# Patient Record
Sex: Male | Born: 1975 | Race: Black or African American | Hispanic: No | Marital: Single | Smoking: Current every day smoker
Health system: Southern US, Community
[De-identification: ages and names within clinical notes are randomized; demographics above are authoritative.]

## PROBLEM LIST (undated history)

## (undated) DIAGNOSIS — I10 Essential (primary) hypertension: Secondary | ICD-10-CM

---

## 2013-03-03 ENCOUNTER — Emergency Department (HOSPITAL_COMMUNITY)
Admission: EM | Admit: 2013-03-03 | Discharge: 2013-03-03 | Disposition: A | Discharge status: Home / Self Care | Payer: No Typology Code available for payment source | Attending: Emergency Medicine | Admitting: Emergency Medicine

## 2013-03-03 ENCOUNTER — Encounter (HOSPITAL_COMMUNITY): Payer: Self-pay | Admitting: Adult Health

## 2013-03-03 ENCOUNTER — Emergency Department (HOSPITAL_COMMUNITY): Payer: No Typology Code available for payment source

## 2013-03-03 DIAGNOSIS — Y9389 Activity, other specified: Secondary | ICD-10-CM | POA: Insufficient documentation

## 2013-03-03 DIAGNOSIS — Y92410 Unspecified street and highway as the place of occurrence of the external cause: Secondary | ICD-10-CM | POA: Insufficient documentation

## 2013-03-03 DIAGNOSIS — S199XXA Unspecified injury of neck, initial encounter: Secondary | ICD-10-CM | POA: Insufficient documentation

## 2013-03-03 DIAGNOSIS — S0993XA Unspecified injury of face, initial encounter: Secondary | ICD-10-CM | POA: Insufficient documentation

## 2013-03-03 MED ORDER — DIAZEPAM 5 MG PO TABS: ORAL_TABLET | ORAL | Status: DC

## 2013-03-03 MED ORDER — IBUPROFEN 400 MG PO TABS
800.0000 mg | ORAL_TABLET | Freq: Once | ORAL | Status: AC
  Administered 2013-03-03: 800 mg via ORAL
  Filled 2013-03-03: qty 2

## 2013-03-03 MED ORDER — HYDROCODONE-ACETAMINOPHEN 5-325 MG PO TABS: ORAL_TABLET | ORAL | Status: DC
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
## 2013-03-03 NOTE — ED Notes (Signed)
Pt c/o neck pain. Pt in no distress. C collar in place.
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
## 2013-03-04 NOTE — ED Provider Notes (Signed)
Medical screening examination/treatment/procedure(s) were performed by non-physician practitioner and as supervising physician I was immediately available for consultation/collaboration.   Calyn Rubi, MD 03/04/13 2144 

## 2019-08-04 ENCOUNTER — Emergency Department (HOSPITAL_COMMUNITY): Payer: Self-pay

## 2019-08-04 ENCOUNTER — Encounter (HOSPITAL_COMMUNITY): Payer: Self-pay | Admitting: Emergency Medicine

## 2019-08-04 ENCOUNTER — Emergency Department (HOSPITAL_COMMUNITY)
Admission: EM | Admit: 2019-08-04 | Discharge: 2019-08-04 | Disposition: A | Discharge status: Home / Self Care | Payer: Self-pay | Attending: Emergency Medicine | Admitting: Emergency Medicine

## 2019-08-04 ENCOUNTER — Other Ambulatory Visit: Payer: Self-pay

## 2019-08-04 DIAGNOSIS — T1490XA Injury, unspecified, initial encounter: Secondary | ICD-10-CM

## 2019-08-04 DIAGNOSIS — S81801A Unspecified open wound, right lower leg, initial encounter: Secondary | ICD-10-CM | POA: Insufficient documentation

## 2019-08-04 DIAGNOSIS — F1010 Alcohol abuse, uncomplicated: Secondary | ICD-10-CM | POA: Insufficient documentation

## 2019-08-04 DIAGNOSIS — Z23 Encounter for immunization: Secondary | ICD-10-CM | POA: Insufficient documentation

## 2019-08-04 DIAGNOSIS — Y9389 Activity, other specified: Secondary | ICD-10-CM | POA: Insufficient documentation

## 2019-08-04 DIAGNOSIS — M79661 Pain in right lower leg: Secondary | ICD-10-CM

## 2019-08-04 DIAGNOSIS — Y998 Other external cause status: Secondary | ICD-10-CM | POA: Insufficient documentation

## 2019-08-04 DIAGNOSIS — Y906 Blood alcohol level of 120-199 mg/100 ml: Secondary | ICD-10-CM | POA: Insufficient documentation

## 2019-08-04 DIAGNOSIS — W3400XA Accidental discharge from unspecified firearms or gun, initial encounter: Secondary | ICD-10-CM

## 2019-08-04 DIAGNOSIS — I998 Other disorder of circulatory system: Secondary | ICD-10-CM

## 2019-08-04 DIAGNOSIS — Y92810 Car as the place of occurrence of the external cause: Secondary | ICD-10-CM | POA: Insufficient documentation

## 2019-08-04 DIAGNOSIS — I10 Essential (primary) hypertension: Secondary | ICD-10-CM | POA: Insufficient documentation

## 2019-08-04 HISTORY — DX: Essential (primary) hypertension: I10

## 2019-08-04 LAB — CBC
HCT: 47.1 % (ref 39.0–52.0)
Hemoglobin: 15.8 g/dL (ref 13.0–17.0)
MCH: 28.5 pg (ref 26.0–34.0)
MCHC: 33.5 g/dL (ref 30.0–36.0)
MCV: 84.9 fL (ref 80.0–100.0)
Platelets: 269 10*3/uL (ref 150–400)
RBC: 5.55 MIL/uL (ref 4.22–5.81)
RDW: 14.6 % (ref 11.5–15.5)
WBC: 12.2 10*3/uL — ABNORMAL HIGH (ref 4.0–10.5)
nRBC: 0 % (ref 0.0–0.2)

## 2019-08-04 LAB — COMPREHENSIVE METABOLIC PANEL
ALT: 26 U/L (ref 0–44)
AST: 29 U/L (ref 15–41)
Albumin: 4.3 g/dL (ref 3.5–5.0)
Alkaline Phosphatase: 57 U/L (ref 38–126)
Anion gap: 13 (ref 5–15)
BUN: 9 mg/dL (ref 6–20)
CO2: 24 mmol/L (ref 22–32)
Calcium: 9.1 mg/dL (ref 8.9–10.3)
Chloride: 105 mmol/L (ref 98–111)
Creatinine, Ser: 1.19 mg/dL (ref 0.61–1.24)
GFR calc Af Amer: >60 mL/min (ref >60)
GFR calc non Af Amer: >60 mL/min (ref >60)
Glucose, Bld: 100 mg/dL — ABNORMAL HIGH (ref 70–99)
Potassium: 3.2 mmol/L — ABNORMAL LOW (ref 3.5–5.1)
Sodium: 142 mmol/L (ref 135–145)
Total Bilirubin: 0.6 mg/dL (ref 0.3–1.2)
Total Protein: 7.4 g/dL (ref 6.5–8.1)

## 2019-08-04 LAB — I-STAT CHEM 8, ED
BUN: 11 mg/dL (ref 6–20)
Calcium, Ion: 1.03 mmol/L — ABNORMAL LOW (ref 1.15–1.40)
Chloride: 106 mmol/L (ref 98–111)
Creatinine, Ser: 1.2 mg/dL (ref 0.61–1.24)
Glucose, Bld: 91 mg/dL (ref 70–99)
HCT: 50 % (ref 39.0–52.0)
Hemoglobin: 17 g/dL (ref 13.0–17.0)
Potassium: 3.4 mmol/L — ABNORMAL LOW (ref 3.5–5.1)
Sodium: 141 mmol/L (ref 135–145)
TCO2: 23 mmol/L (ref 22–32)

## 2019-08-04 LAB — SAMPLE TO BLOOD BANK

## 2019-08-04 LAB — LACTIC ACID, PLASMA: Lactic Acid, Venous: 4.2 mmol/L (ref 0.5–1.9)

## 2019-08-04 LAB — ETHANOL: Alcohol, Ethyl (B): 173 mg/dL — ABNORMAL HIGH (ref <10)

## 2019-08-04 LAB — CDS SEROLOGY

## 2019-08-04 LAB — PROTIME-INR
INR: 0.9 (ref 0.8–1.2)
Prothrombin Time: 12.2 seconds (ref 11.4–15.2)

## 2019-08-04 MED ORDER — TETANUS-DIPHTH-ACELL PERTUSSIS 5-2.5-18.5 LF-MCG/0.5 IM SUSP
0.5000 mL | Freq: Once | INTRAMUSCULAR | Status: AC
  Administered 2019-08-04: 0.5 mL via INTRAMUSCULAR
  Filled 2019-08-04: qty 0.5

## 2019-08-04 MED ORDER — IOHEXOL 350 MG/ML SOLN
100.0000 mL | Freq: Once | INTRAVENOUS | Status: AC | PRN
  Administered 2019-08-04: 100 mL via INTRAVENOUS

## 2019-08-04 MED ORDER — CEFAZOLIN SODIUM-DEXTROSE 1-4 GM/50ML-% IV SOLN
1.0000 g | Freq: Once | INTRAVENOUS | Status: AC
  Administered 2019-08-04: 1 g via INTRAVENOUS
  Filled 2019-08-04: qty 50

## 2019-08-04 MED ORDER — CEPHALEXIN 500 MG PO CAPS: 500.0000 mg | ORAL_CAPSULE | Freq: Four times a day (QID) | ORAL | 0 refills | Status: DC
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
## 2019-08-04 NOTE — ED Notes (Signed)
Pt. Verbalized discharge instructions and stated understandimng

## 2019-08-04 NOTE — ED Notes (Signed)
Pt wife Tequitta [phone]
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
## 2019-08-04 NOTE — Discharge Instructions (Signed)
Keep leg elevated. Do regular wound care Take antibiotics Return if increased swelling, redness, or discharge
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
## 2019-08-04 NOTE — ED Notes (Signed)
Patient transported to x-ray. ?

## 2020-02-04 ENCOUNTER — Ambulatory Visit: Payer: Self-pay | Attending: Internal Medicine

## 2020-02-04 DIAGNOSIS — Z23 Encounter for immunization: Secondary | ICD-10-CM

## 2020-02-04 NOTE — Progress Notes (Signed)
   Covid-19 Vaccination Clinic  Name:  Spiros Greenfeld    MRN: [id number] DOB: 08/17/1976  02/04/2020  Mr. Stankey was observed post Covid-19 immunization for 15 minutes without incident. He was provided with Vaccine Information Sheet and instruction to access the V-Safe system.   Mr. Welshans was instructed to call 911 with any severe reactions post vaccine: Marland Kitchen Difficulty breathing  . Swelling of face and throat  . A fast heartbeat  . A bad rash all over body  . Dizziness and weakness   Immunizations Administered    Name Date Dose VIS Date Route   Pfizer COVID-19 Vaccine 02/04/2020  9:13 AM 0.3 mL 10/22/2019 Intramuscular   Manufacturer: [employer], [employer]   Lot: [id number]   NDC: [address]-9

## 2020-02-28 ENCOUNTER — Ambulatory Visit: Payer: Self-pay | Attending: Internal Medicine

## 2020-04-02 ENCOUNTER — Emergency Department (HOSPITAL_COMMUNITY)
Admission: EM | Admit: 2020-04-02 | Discharge: 2020-04-02 | Disposition: A | Discharge status: Home / Self Care | Payer: No Typology Code available for payment source | Attending: Emergency Medicine | Admitting: Emergency Medicine

## 2020-04-02 ENCOUNTER — Emergency Department (HOSPITAL_COMMUNITY): Payer: No Typology Code available for payment source

## 2020-04-02 ENCOUNTER — Other Ambulatory Visit: Payer: Self-pay

## 2020-04-02 ENCOUNTER — Encounter (HOSPITAL_COMMUNITY): Payer: Self-pay | Admitting: Emergency Medicine

## 2020-04-02 DIAGNOSIS — Y929 Unspecified place or not applicable: Secondary | ICD-10-CM | POA: Insufficient documentation

## 2020-04-02 DIAGNOSIS — Y999 Unspecified external cause status: Secondary | ICD-10-CM | POA: Diagnosis not present

## 2020-04-02 DIAGNOSIS — S52612A Displaced fracture of left ulna styloid process, initial encounter for closed fracture: Secondary | ICD-10-CM | POA: Insufficient documentation

## 2020-04-02 DIAGNOSIS — Y939 Activity, unspecified: Secondary | ICD-10-CM | POA: Diagnosis not present

## 2020-04-02 DIAGNOSIS — S52352A Displaced comminuted fracture of shaft of radius, left arm, initial encounter for closed fracture: Secondary | ICD-10-CM | POA: Diagnosis not present

## 2020-04-02 DIAGNOSIS — Z20822 Contact with and (suspected) exposure to covid-19: Secondary | ICD-10-CM | POA: Insufficient documentation

## 2020-04-02 DIAGNOSIS — Z23 Encounter for immunization: Secondary | ICD-10-CM | POA: Diagnosis not present

## 2020-04-02 DIAGNOSIS — F17210 Nicotine dependence, cigarettes, uncomplicated: Secondary | ICD-10-CM | POA: Diagnosis not present

## 2020-04-02 DIAGNOSIS — S51812A Laceration without foreign body of left forearm, initial encounter: Secondary | ICD-10-CM | POA: Diagnosis not present

## 2020-04-02 DIAGNOSIS — M79602 Pain in left arm: Secondary | ICD-10-CM | POA: Diagnosis present

## 2020-04-02 DIAGNOSIS — S5292XA Unspecified fracture of left forearm, initial encounter for closed fracture: Secondary | ICD-10-CM

## 2020-04-02 LAB — CBC WITH DIFFERENTIAL/PLATELET
Abs Immature Granulocytes: 0.03 10*3/uL (ref 0.00–0.07)
Basophils Absolute: 0.1 10*3/uL (ref 0.0–0.1)
Basophils Relative: 1 %
Eosinophils Absolute: 0.1 10*3/uL (ref 0.0–0.5)
Eosinophils Relative: 1 %
HCT: 46.5 % (ref 39.0–52.0)
Hemoglobin: 15.4 g/dL (ref 13.0–17.0)
Immature Granulocytes: 0 %
Lymphocytes Relative: 31 %
Lymphs Abs: 2.2 10*3/uL (ref 0.7–4.0)
MCH: 28.4 pg (ref 26.0–34.0)
MCHC: 33.1 g/dL (ref 30.0–36.0)
MCV: 85.8 fL (ref 80.0–100.0)
Monocytes Absolute: 0.6 10*3/uL (ref 0.1–1.0)
Monocytes Relative: 8 %
Neutro Abs: 4.1 10*3/uL (ref 1.7–7.7)
Neutrophils Relative %: 59 %
Platelets: 204 10*3/uL (ref 150–400)
RBC: 5.42 MIL/uL (ref 4.22–5.81)
RDW: 14.8 % (ref 11.5–15.5)
WBC: 7 10*3/uL (ref 4.0–10.5)
nRBC: 0 % (ref 0.0–0.2)

## 2020-04-02 LAB — SARS CORONAVIRUS 2 BY RT PCR (HOSPITAL ORDER, PERFORMED IN ~~LOC~~ HOSPITAL LAB): SARS Coronavirus 2: NEGATIVE

## 2020-04-02 LAB — ABO/RH: ABO/RH(D): O POS

## 2020-04-02 LAB — ETHANOL: Alcohol, Ethyl (B): <10 mg/dL (ref <10)

## 2020-04-02 LAB — TYPE AND SCREEN
ABO/RH(D): O POS
Antibody Screen: NEGATIVE

## 2020-04-02 MED ORDER — CEFAZOLIN SODIUM-DEXTROSE 2-4 GM/100ML-% IV SOLN
2.0000 g | INTRAVENOUS | Status: AC
  Administered 2020-04-02: 2 g via INTRAVENOUS
  Filled 2020-04-02: qty 100

## 2020-04-02 MED ORDER — CEPHALEXIN 500 MG PO CAPS
500.0000 mg | ORAL_CAPSULE | Freq: Four times a day (QID) | ORAL | 0 refills | Status: DC
Start: 2020-04-02 — End: 2023-06-23

## 2020-04-02 MED ORDER — TETANUS-DIPHTH-ACELL PERTUSSIS 5-2.5-18.5 LF-MCG/0.5 IM SUSP
0.5000 mL | Freq: Once | INTRAMUSCULAR | Status: AC
  Administered 2020-04-02: 0.5 mL via INTRAMUSCULAR
  Filled 2020-04-02: qty 0.5

## 2020-04-02 NOTE — Discharge Instructions (Signed)
Please return for any problem.  Follow-up with Dr. Eulah Pont as instructed (call his office tomorrow morning for an appointment).  Use Keflex as prescribed to treat possible infection.

## 2020-04-02 NOTE — ED Notes (Signed)
Called ortho tech for spint placement
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
## 2020-04-02 NOTE — Progress Notes (Signed)
Orthopedic Tech Progress Note Patient Details:  Michael Rangel [birth year] [id number]  Ortho Devices Type of Ortho Device: Arm sling, Sugartong splint       [name] 04/02/2020, 1:55 PM

## 2020-04-03 ENCOUNTER — Encounter (HOSPITAL_COMMUNITY): Payer: Self-pay | Admitting: Emergency Medicine

## 2020-05-16 ENCOUNTER — Ambulatory Visit (INDEPENDENT_AMBULATORY_CARE_PROVIDER_SITE_OTHER): Payer: 59

## 2020-05-16 ENCOUNTER — Ambulatory Visit (INDEPENDENT_AMBULATORY_CARE_PROVIDER_SITE_OTHER): Payer: 59 | Admitting: Orthopaedic Surgery

## 2020-05-16 DIAGNOSIS — M25532 Pain in left wrist: Secondary | ICD-10-CM

## 2020-05-16 DIAGNOSIS — M79632 Pain in left forearm: Secondary | ICD-10-CM

## 2020-05-16 NOTE — Progress Notes (Signed)
Office Visit Note   Patient: Michael Rangel           Date of Birth: 1975-12-20           MRN: [id number] Visit Date: 05/16/2020              Requested by: No referring provider defined for this encounter. PCP: Patient, No Pcp Per   Assessment & Plan: Visit Diagnoses:  1. Left forearm pain   2. Pain in left wrist     Plan: Impression is subacute left radial shaft fracture.  I looked at the ER notes from the initial visit and orthopedics was called at that time but the patient failed to follow-up in a reasonable timeframe.  There were pictures of a superficial laceration on the radial side of the forearm.  There was no evidence of open fracture.  At this point with the fracture being 40 weeks old and evidence of callus formation I do not feel that surgical intervention is indicated.  He does have some shortening of the radial shaft.  We will place him in outpatient OT.  We gave him Velcro wrist brace to wear.  Recheck in 6 weeks.  Questions encouraged and answered.  Follow-Up Instructions: Return in about 6 weeks (around 06/27/2020).   Orders:  Orders Placed This Encounter  Procedures  . XR Forearm Left  . XR Wrist Complete Left  . Ambulatory referral to Occupational Therapy   No orders of the defined types were placed in this encounter.     Procedures: No procedures performed   Clinical Data: No additional findings.   Subjective: Chief Complaint  Patient presents with  . Left Wrist - Pain    [name] is a 44 year old gentleman here for evaluation of chronic left arm and wrist pain for the last 3 weeks.  He states that he has had pain for a while.  He is a poor historian.  He is unsure how he injured his left arm.  He has been wearing a sugar tong splint since his initial evaluation in the ER 6 weeks ago.  He is unable to give me any meaningful HPI details.   Review of Systems  Constitutional: Negative.   All other systems reviewed and are  negative.    Objective: Vital Signs: There were no vitals taken for this visit.  Physical Exam Vitals and nursing note reviewed.  Constitutional:      Appearance: He is well-developed.  HENT:     Head: Normocephalic and atraumatic.  Eyes:     Pupils: Pupils are equal, round, and reactive to light.  Pulmonary:     Effort: Pulmonary effort is normal.  Abdominal:     Palpations: Abdomen is soft.  Musculoskeletal:        General: Normal range of motion.     Cervical back: Neck supple.  Skin:    General: Skin is warm.  Neurological:     Mental Status: He is alert and oriented to person, place, and time.  Psychiatric:        Behavior: Behavior normal.        Thought Content: Thought content normal.        Judgment: Judgment normal.     Ortho Exam Left forearm shows a healed traumatic laceration.  No bony crepitus or movement.  Neurovascular intact.  Moderate swelling. Specialty Comments:  No specialty comments available.  Imaging: XR Wrist Complete Left  Result Date: 05/16/2020 Comminuted distal radial shaft fracture with callus  formation  XR Forearm Left  Result Date: 05/16/2020 Comminuted distal radial shaft fracture with callus formation    PMFS History: There are no problems to display for this patient.  Past Medical History:  Diagnosis Date  . Hypertension     No family history on file.  No past surgical history on file. Social History   Occupational History  . Not on file  Tobacco Use  . Smoking status: Current Every Day Smoker  . Smokeless tobacco: Never Used  Substance and Sexual Activity  . Alcohol use: Yes  . Drug use: Yes    Types: Marijuana  . Sexual activity: Not on file
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
## 2023-06-23 ENCOUNTER — Encounter (HOSPITAL_COMMUNITY): Payer: Self-pay | Admitting: *Deleted

## 2023-06-23 ENCOUNTER — Ambulatory Visit (HOSPITAL_COMMUNITY)
Admission: EM | Admit: 2023-06-23 | Discharge: 2023-06-23 | Disposition: A | Discharge status: Home / Self Care | Payer: Self-pay | Attending: Family Medicine | Admitting: Family Medicine

## 2023-06-23 DIAGNOSIS — I10 Essential (primary) hypertension: Secondary | ICD-10-CM

## 2023-06-23 MED ORDER — AMLODIPINE BESYLATE 5 MG PO TABS: 5.0000 mg | ORAL_TABLET | Freq: Every day | ORAL | 2 refills | Status: AC

## 2023-06-23 MED ORDER — LISINOPRIL 20 MG PO TABS: 20.0000 mg | ORAL_TABLET | Freq: Every day | ORAL | 2 refills | Status: AC

## 2023-06-23 NOTE — Discharge Instructions (Signed)
You have had labs (blood test to look at your electrolytes and kidney function) sent today. We will call you with any significant abnormalities or if there is need to begin or change treatment or pursue further follow up.  You may also review your test results online through MyChart. If you do not have a MyChart account, instructions to sign up should be on your discharge paperwork.

## 2023-06-23 NOTE — ED Triage Notes (Signed)
Pt stats he has hypertension and has been off meds for over a year. He states last week he couldn't hardly read due to headache and blurry vision.

## 2023-06-26 NOTE — ED Provider Notes (Signed)
[facility]   [id number] 06/23/23 Arrival Time: 0949  ASSESSMENT & PLAN:  1. Uncontrolled hypertension    BP (!) 171/117 (BP Location: Left Arm)   Pulse 66   Temp 97.9 F (36.6 C) (Oral)   Resp 18   SpO2 98%   Meds ordered this encounter  Medications   lisinopril (ZESTRIL) 20 MG tablet    Sig: Take 1 tablet (20 mg total) by mouth daily.    Dispense:  30 tablet    Refill:  2   amLODipine (NORVASC) 5 MG tablet    Sig: Take 1 tablet (5 mg total) by mouth daily.    Dispense:  30 tablet    Refill:  2   Recommend:  Follow-up Information     Schedule an appointment as soon as possible for a visit  with [facility] at [facility].   Specialty: Family Medicine Contact information: [address] [place] [address] [phone]        Schedule an appointment as soon as possible for a visit  with [facility].   Contact information: [address] [place] [address] [phone]                Reviewed expectations re: course of current medical issues. Questions answered. Outlined signs and symptoms indicating need for more acute intervention. Patient verbalized understanding. After Visit Summary given.   SUBJECTIVE:  Michael Rangel is a 47 y.o. male who presents with concerns regarding increased blood pressures. Reports that he has been treated for hypertension in the past. Needs refills.  No worrisome symptoms.  Social History   Tobacco Use  Smoking Status Every Day   Types: Cigars  Smokeless Tobacco Never     ROS: As per HPI.   OBJECTIVE:  Vitals:   06/23/23 1143  BP: (!) 171/117  Pulse: 66  Resp: 18  Temp: 97.9 F (36.6 C)  TempSrc: Oral  SpO2: 98%    General appearance: alert; no distress Eyes: PERRLA; EOMI HENT: normocephalic; atraumatic Neck: supple Lungs: clear to auscultation bilaterally Heart: regular  rate and rhythm without murmer Abdomen: soft, non-tender; bowel sounds normal Extremities: no edema; symmetrical with no gross deformities Skin: warm and dry Psychological: alert and cooperative; normal mood and affect  ECG: No orders found for this or any previous visit.  Labs: Results for orders placed or performed during the hospital encounter of 04/02/20  SARS Coronavirus 2 by RT PCR (hospital order, performed in [facility] hospital lab) Nasopharyngeal Nasopharyngeal Swab   Specimen: Nasopharyngeal Swab  Result Value Ref Range   SARS Coronavirus 2 NEGATIVE NEGATIVE  CBC with Differential  Result Value Ref Range   WBC 7.0 4.0 - 10.5 K/uL   RBC 5.42 4.22 - 5.81 MIL/uL   Hemoglobin 15.4 13.0 - 17.0 g/dL   HCT 95.1 88.4 - 16.6 %   MCV 85.8 80.0 - 100.0 fL   MCH 28.4 26.0 - 34.0 pg   MCHC 33.1 30.0 - 36.0 g/dL   RDW 06.3 01.6 - 01.0 %   Platelets 204 150 - 400 K/uL   nRBC 0.0 0.0 - 0.2 %   Neutrophils Relative % 59 %   Neutro Abs 4.1 1.7 - 7.7 K/uL   Lymphocytes Relative 31 %   Lymphs Abs 2.2 0.7 - 4.0 K/uL   Monocytes Relative 8 %   Monocytes Absolute 0.6 0.1 - 1.0 K/uL   Eosinophils Relative  1 %   Eosinophils Absolute 0.1 0.0 - 0.5 K/uL   Basophils Relative 1 %   Basophils Absolute 0.1 0.0 - 0.1 K/uL   Immature Granulocytes 0 %   Abs Immature Granulocytes 0.03 0.00 - 0.07 K/uL  Ethanol  Result Value Ref Range   Alcohol, Ethyl (B) <10 <10 mg/dL  Type and screen [facility]  Result Value Ref Range   ABO/RH(D) O POS    Antibody Screen NEG    Sample Expiration      04/05/2020,2359 Performed at [facility], [address] [place], [place] [address]   ABO/Rh  Result Value Ref Range   ABO/RH(D)      O POS Performed at [facility], [address], [place], [place] [address]    Labs Reviewed - No data to display  Imaging: No results found.  No Known Allergies  Past Medical History:  Diagnosis Date   Hypertension    Social  History   Socioeconomic History   Marital status: Single    Spouse name: Not on file   Number of children: Not on file   Years of education: Not on file   Highest education level: Not on file  Occupational History   Not on file  Tobacco Use   Smoking status: Every Day    Types: Cigars   Smokeless tobacco: Never  Vaping Use   Vaping status: Never Used  Substance and Sexual Activity   Alcohol use: Yes   Drug use: Yes    Types: Marijuana   Sexual activity: Yes    Birth control/protection: None  Other Topics Concern   Not on file  Social History Narrative   ** Merged History Encounter **       Social Determinants of Health   Financial Resource Strain: Not on file  Food Insecurity: Not on file  Transportation Needs: Not on file  Physical Activity: Not on file  Stress: Not on file  Social Connections: Not on file  Intimate Partner Violence: Not on file   History reviewed. No pertinent family history. History reviewed. No pertinent surgical history.     Mardella Layman, MD 06/26/23 [phone]
